# Patient Record
Sex: Female | Born: 1990 | Race: White | Hispanic: No | Marital: Married | Smoking: Never smoker
Health system: Southern US, Community
[De-identification: ages and names within clinical notes are randomized; demographics above are authoritative.]

## PROBLEM LIST (undated history)

## (undated) DIAGNOSIS — Z789 Other specified health status: Secondary | ICD-10-CM

---

## 2019-07-20 ENCOUNTER — Inpatient Hospital Stay (HOSPITAL_COMMUNITY): Payer: BC Managed Care – PPO

## 2019-07-20 ENCOUNTER — Encounter (HOSPITAL_COMMUNITY): Payer: Self-pay | Admitting: Obstetrics & Gynecology

## 2019-07-20 ENCOUNTER — Emergency Department: Admission: EM | Admit: 2019-07-20 | Discharge: 2019-07-20 | Discharge status: Against Medical Advice | Payer: Self-pay

## 2019-07-20 ENCOUNTER — Other Ambulatory Visit: Payer: Self-pay

## 2019-07-20 ENCOUNTER — Inpatient Hospital Stay (HOSPITAL_COMMUNITY)
Admission: AD | Admit: 2019-07-20 | Discharge: 2019-07-20 | Disposition: A | Discharge status: Home / Self Care | Payer: BC Managed Care – PPO | Attending: Obstetrics & Gynecology | Admitting: Obstetrics & Gynecology

## 2019-07-20 DIAGNOSIS — O034 Incomplete spontaneous abortion without complication: Secondary | ICD-10-CM

## 2019-07-20 DIAGNOSIS — O046 Delayed or excessive hemorrhage following (induced) termination of pregnancy: Secondary | ICD-10-CM

## 2019-07-20 DIAGNOSIS — O0489 (Induced) termination of pregnancy with other complications: Secondary | ICD-10-CM

## 2019-07-20 DIAGNOSIS — O031 Delayed or excessive hemorrhage following incomplete spontaneous abortion: Secondary | ICD-10-CM | POA: Diagnosis present

## 2019-07-20 DIAGNOSIS — O036 Delayed or excessive hemorrhage following complete or unspecified spontaneous abortion: Secondary | ICD-10-CM

## 2019-07-20 HISTORY — DX: Other specified health status: Z78.9

## 2019-07-20 LAB — CBC WITH DIFFERENTIAL/PLATELET
Abs Immature Granulocytes: 0.02 10*3/uL (ref 0.00–0.07)
Basophils Absolute: 0 10*3/uL (ref 0.0–0.1)
Basophils Relative: 0 %
Eosinophils Absolute: 0.1 10*3/uL (ref 0.0–0.5)
Eosinophils Relative: 1 %
HCT: 40.9 % (ref 36.0–46.0)
Hemoglobin: 13.8 g/dL (ref 12.0–15.0)
Immature Granulocytes: 0 %
Lymphocytes Relative: 23 %
Lymphs Abs: 1.9 10*3/uL (ref 0.7–4.0)
MCH: 31.7 pg (ref 26.0–34.0)
MCHC: 33.7 g/dL (ref 30.0–36.0)
MCV: 94 fL (ref 80.0–100.0)
Monocytes Absolute: 0.4 10*3/uL (ref 0.1–1.0)
Monocytes Relative: 5 %
Neutro Abs: 5.8 10*3/uL (ref 1.7–7.7)
Neutrophils Relative %: 71 %
Platelets: 262 10*3/uL (ref 150–400)
RBC: 4.35 MIL/uL (ref 3.87–5.11)
RDW: 12 % (ref 11.5–15.5)
WBC: 8.3 10*3/uL (ref 4.0–10.5)
nRBC: 0 % (ref 0.0–0.2)

## 2019-07-20 LAB — TYPE AND SCREEN
ABO/RH(D): A POS
Antibody Screen: NEGATIVE

## 2019-07-20 LAB — HCG, QUANTITATIVE, PREGNANCY: hCG, Beta Chain, Quant, S: 50 m[IU]/mL — ABNORMAL HIGH (ref <5)

## 2019-07-20 LAB — ABO/RH: ABO/RH(D): A POS

## 2019-07-20 LAB — POCT PREGNANCY, URINE: Preg Test, Ur: POSITIVE — AB

## 2019-07-20 MED ORDER — MISOPROSTOL 200 MCG PO TABS: ORAL_TABLET | ORAL | 1 refills | Status: AC

## 2019-07-20 MED ORDER — TRAMADOL HCL 50 MG PO TABS: 100.0000 mg | ORAL_TABLET | Freq: Four times a day (QID) | ORAL | 0 refills | Status: AC | PRN

## 2019-07-20 NOTE — MAU Note (Addendum)
Monica Beltran is a 29 y.o. here in MAU reporting: states she had a termination 3 weeks ago. States she is still passing tissue and had some clots yesterday. Took a UPT this morning and it was still showing positive. Some intermittent  cramping.   Onset of complaint: ongoing  Pain score: 0/10  Vitals:   07/20/19 1416  BP: 114/72  Pulse: 80  Resp: 16  Temp: 97.9 F (36.6 C)  SpO2: 99%     Lab orders placed from triage: UPT, pt unable to give sample at this time, pt to wait in the lobby until able to provide a sample

## 2019-07-20 NOTE — Discharge Instructions (Signed)
Prostaglandin-Induced Abortion  Prostaglandin-induced abortion is a procedure that uses prostaglandins to end a pregnancy. Prostaglandins are hormone-like medicines used to start labor by making the uterus tighten (contract). Prostaglandin-induced abortion can be done during the first trimester of pregnancy. Tell a health care provider about:  Any allergies you have.  All medicines you are taking, including vitamins, herbs, eye drops, creams, and over-the-counter medicines.  Any problems you or family members have had with anesthetic medicines.  Any blood disorders you have.  Any surgeries you have had.  Any medical conditions you have. What are the risks? Generally, this is a safe procedure. However, problems may occur, including:  Parts of the placenta and other afterbirth remaining in your uterus. If this happens, you may need to have a procedure where the inside of your uterus is scraped to remove it (dilation and curettage, or D&C).  Heavy bleeding.  Painful cramping that is not relieved by over-the-counter medicines.  Infection.  Failure to end the pregnancy. If this happens, you may need to have a dilation and curettage. What happens before the procedure?  You may have an exam of your outer and inner genitals and reproductive organs (pelvic exam).  You may have a blood test to confirm that you are pregnant.  You may have an ultrasound of your uterus.  After discussing the procedure with your health care provider, you will be asked to sign a consent form.  Ask your health care provider about changing or stopping your regular medicines. This is especially important if you are taking diabetes medicines or blood thinners. What happens during the procedure?  You will take prostaglandin in tablet form by mouth. After you take the medicine, you will be able to go home.  The medicine will induce bleeding that may last for a few hours or up to a few days, similar to a heavy  menstrual period.  Within 1-3 days of taking this medicine, you will be instructed to take more medicine by mouth or by putting it directly into your vagina (suppository). Follow your health care provider's instructions carefully. The procedure may vary among health care providers and hospitals. What happens after the procedure?  You may have cramps for a few days. They may feel like menstrual cramps.  You will continue to have bleeding that may last up to a few days, similar to a heavy menstrual period.  After about 2 weeks, or when you have stopped bleeding, you may have an ultrasound to confirm that you are no longer pregnant. Summary  Prostaglandin-induced abortion is a procedure that uses prostaglandins to end a pregnancy.  This is a safe procedure. However, problems may occur including failure to remove all the placenta and afterbirth and failure to end the pregnancy. Other problems include cramping, bleeding, and infection.  You will take a prostaglandin in tablet form, by mouth. After you take the medicine, you will be able to go home.  After about 2 weeks, or when you have stopped bleeding, you may have an ultrasound to confirm that you are no longer pregnant. This information is not intended to replace advice given to you by your health care provider. Make sure you discuss any questions you have with your health care provider. Document Revised: 02/16/2017 Document Reviewed: 05/24/2016 Elsevier Patient Education  2020 Elsevier Inc.  

## 2019-07-20 NOTE — MAU Provider Note (Addendum)
Chief Complaint: Vaginal Bleeding   First Provider Initiated Contact with Patient 07/20/19 1556     SUBJECTIVE HPI: Monica Beltran is a 29 y.o. [id number] at 1 month S/P elective termination of pregnancy in [place] at 7.4 weeks w/ what sounds like Cytotec (given choice of 4 tablets vaginally or 4 tablets buccally for two doses 24 hours apart. Used medicine vaginally. Two tablets fell out when placing second dose.) She presents to [facility]missions reporting bleeding and passing tissue ever since taking medication for termination. Took UTP this morning and it was still positive. Went to ED in [facility] for same concerns. States she was told that there was still tissue in her uterus and scheduled for F/U but did not keep appt. Has not resumed intercourse.   Is staying in [place] until 07/25/19 then moving to [place] due to husband's new job. Has 65 month-old child and limited support making it difficult to attend appts.    Location: low abd Quality: cramping Severity: mild Duration: 3-4 weeks Context: S/P medical EAB Timing: intermittent Modifying factors: nothing. Hasn't tried anything for the pain. Associated signs and symptoms: pos for vaginal bleeding and passing tissue. Neg for fever, chills,dizziness, vaginal discharge, urinary complaints, gi complaints.    Past Medical History:  Diagnosis Date  . Medical history non-contributory    OB History  Gravida Para Term Preterm AB Living  3       2 1   SAB TAB Ectopic Multiple Live Births    2     1    # Outcome Date GA Lbr Len/2nd Weight Sex Delivery Anes PTL Lv  3 TAB 06/25/19          2 Gravida 03/19/17    M CS-Unspec   LIV  1 TAB 2014           Past Surgical History:  Procedure Laterality Date  . CESAREAN SECTION     Social History   Socioeconomic History  . Marital status: Married    Spouse name: Not on file  . Number of children: Not on file  . Years of education: Not on file  . Highest education level: Not on file  Occupational  History  . Not on file  Tobacco Use  . Smoking status: Never Smoker  . Smokeless tobacco: Never Used  Substance and Sexual Activity  . Alcohol use: Not Currently  . Drug use: Not Currently  . Sexual activity: Yes    Birth control/protection: None  Other Topics Concern  . Not on file  Social History Narrative  . Not on file   Social Determinants of Health   Financial Resource Strain:   . Difficulty of Paying Living Expenses:   Food Insecurity:   . Worried About 2015 in the Last Year:   . Programme researcher, broadcasting/film/video in the Last Year:   Transportation Needs:   . Barista (Medical):   Freight forwarder Lack of Transportation (Non-Medical):   Physical Activity:   . Days of Exercise per Week:   . Minutes of Exercise per Session:   Stress:   . Feeling of Stress :   Social Connections:   . Frequency of Communication with Friends and Family:   . Frequency of Social Gatherings with Friends and Family:   . Attends Religious Services:   . Active Member of Clubs or Organizations:   . Attends [employer] Meetings:   Banker Marital Status:   Intimate Partner Violence:   . Fear of  Current or Ex-Partner:   . Emotionally Abused:   [employer] Physically Abused:   . Sexually Abused:    History reviewed. No pertinent family history. No current facility-administered medications on file prior to encounter.   No current outpatient medications on file prior to encounter.   No Known Allergies  I have reviewed patient's Past Medical Hx, Surgical Hx, Family Hx, Social Hx, medications and allergies.   Review of Systems  Constitutional: Negative for chills and fever.  Gastrointestinal: Positive for abdominal pain. Negative for constipation, diarrhea, nausea and vomiting.  Genitourinary: Positive for vaginal bleeding. Negative for dysuria, frequency, urgency and vaginal discharge.  Neurological: Negative for dizziness.    OBJECTIVE Patient Vitals for the past 24 hrs:  BP Temp Temp src Pulse  Resp SpO2 Height Weight  07/20/19 1416 114/72 97.9 F (36.6 C) Oral 80 16 99 % - -  07/20/19 1412 - - - - - - 5\' 3"  (1.6 m) 66.7 kg   Constitutional: Well-developed, well-nourished female in no acute distress.  Skin: No pallor Cardiovascular: normal rate Respiratory: normal rate and effort.  GI: Abd soft, non-tender. MS: Extremities nontender, no edema, normal ROM Neurologic: Alert and oriented x 4.  GU:  SPECULUM EXAM: NEFG, physiologic discharge, small amount of blood noted, cervix clean. No tissue in vagina or os.  BIMANUAL: cervix closed; uterus top-normal size, no adnexal tenderness or masses. No CMT.  LAB RESULTS Results for orders placed or performed during the hospital encounter of 07/20/19 (from the past 24 hour(s))  Pregnancy, urine POC     Status: Abnormal   Collection Time: 07/20/19  3:02 PM  Result Value Ref Range   Preg Test, Ur POSITIVE (A) NEGATIVE  hCG, quantitative, pregnancy     Status: Abnormal   Collection Time: 07/20/19  3:29 PM  Result Value Ref Range   hCG, Beta Chain, Quant, S 50 (H) <5 mIU/mL  CBC with Differential/Platelet     Status: None   Collection Time: 07/20/19  3:29 PM  Result Value Ref Range   WBC 8.3 4.0 - 10.5 K/uL   RBC 4.35 3.87 - 5.11 MIL/uL   Hemoglobin 13.8 12.0 - 15.0 g/dL   HCT 09/19/19 98.3 - 38.2 %   MCV 94.0 80.0 - 100.0 fL   MCH 31.7 26.0 - 34.0 pg   MCHC 33.7 30.0 - 36.0 g/dL   RDW 50.5 39.7 - 67.3 %   Platelets 262 150 - 400 K/uL   nRBC 0.0 0.0 - 0.2 %   Neutrophils Relative % 71 %   Neutro Abs 5.8 1.7 - 7.7 K/uL   Lymphocytes Relative 23 %   Lymphs Abs 1.9 0.7 - 4.0 K/uL   Monocytes Relative 5 %   Monocytes Absolute 0.4 0.1 - 1.0 K/uL   Eosinophils Relative 1 %   Eosinophils Absolute 0.1 0.0 - 0.5 K/uL   Basophils Relative 0 %   Basophils Absolute 0.0 0.0 - 0.1 K/uL   Immature Granulocytes 0 %   Abs Immature Granulocytes 0.02 0.00 - 0.07 K/uL  Type and screen     Status: None   Collection Time: 07/20/19  3:29 PM   Result Value Ref Range   ABO/RH(D) A POS    Antibody Screen NEG    Sample Expiration      07/23/2019,2359 Performed at [facility], [address], [place], [place]   ABO/Rh     Status: None (Preliminary result)   Collection Time: 07/20/19  3:29 PM  Result  Value Ref Range   ABO/RH(D)      A POS Performed at [facility], [address], [place], [place] [address]     IMAGING US OB LESS THAN 14 WEEKS WITH OB TRANSVAGINAL  Result Date: 07/20/2019 CLINICAL DATA:  Retained products of conception. Status post medical abortion on 06/19/2019 EXAM: OBSTETRIC <14 WK [place] AND TRANSVAGINAL OB US TECHNIQUE: Both transabdominal and transvaginal ultrasound examinations were performed for complete evaluation of the gestation as well as the maternal uterus, adnexal regions, and pelvic cul-de-sac. Transvaginal technique was performed to assess early pregnancy. COMPARISON:  None. FINDINGS: Intrauterine gestational sac: None Yolk sac:  Not Visualized. Embryo:  Not Visualized. Maternal uterus/adnexae: The endometrium is thickened measuring approximately 1.7 cm. The endometrium is heterogeneous without definite increased vascularity. A prior Caesarean section scar is noted. The ovaries are unremarkable. There is no pelvic free fluid. IMPRESSION: 1. No IUP. 2. Thickened heterogeneous endometrium without evidence for increased vascularity. Findings are nonspecific and could indicate endometrial blood products although hypovascular retained products of conception cannot be fully excluded. Consider short-term follow-up pelvic ultrasound or further evaluation with pelvic MRI without and with IV contrast, as clinically warranted. Electronically Signed   By: Katherine Mantle M.D.   On: 07/20/2019 17:15    MAU COURSE Orders Placed This Encounter  Procedures  . US OB LESS THAN 14 WEEKS WITH OB TRANSVAGINAL  . hCG, quantitative, pregnancy  . CBC with Differential/Platelet  . Diet NPO time  specified  . Pregnancy, urine POC  . Type and screen  . ABO/Rh  . Discharge patient   Meds ordered this encounter  Medications  . misoprostol (CYTOTEC) 200 MCG tablet    Sig: Place four tablets in between your gums and cheeks (two tablets on each side) as instructed OR insert four tablets vaginally. Repeat dose in 24 hours.    Dispense:  4 tablet    Refill:  1    Order Specific Question:   Supervising Provider    Answer:   Adam Phenix [[id number]]  . traMADol (ULTRAM) 50 MG tablet    Sig: Take 2 tablets (100 mg total) by mouth every 6 (six) hours as needed for severe pain.    Dispense:  20 tablet    Refill:  0    Order Specific Question:   Supervising Provider    Answer:   Adam Phenix [phone]    MDM - Discussed concern for retained POCs per Hx, [place] and Hcg w/ pt. No Sx infection. Discussed repeating Cytotec and need for F/U to make sure it has worked. Initially doubtful that she can F/U before moving to [place]. Dicussed w/ Dr. Debroah Loop. Offered D&C this week. Pt wants to try Cytotec again instead. Can't get childcare for D&C. Explained importance of F/U to ensure completion of evacuation of POCs and risk of infection if not.   ASSESSMENT 1. Retained products of conception following abortion   2. Abortion complicated by delayed or excessive hemorrhage     PLAN Discharge home in stable condition. Infection and bleeding precautions Follow-up Information    [facility] Follow up on 07/23/2019.   Specialty: Obstetrics and Gynecology Why: will call to schedule follow-up appointment  Contact information: [address] [place] [address] [phone]       [facility] Follow up.   Specialty: Obstetrics and Gynecology Why: as needed for pregnancy-related emergencies (severe bleeding, fever greater than 100.4, severe  stomach pain) Contact information: [address] [id number] [place] [phone]         Allergies as of 07/20/2019   No Known Allergies     Medication List    TAKE these medications   misoprostol 200 MCG tablet Commonly known as: CYTOTEC Place four tablets in between your gums and cheeks (two tablets on each side) as instructed OR insert four tablets vaginally. Repeat dose in 24 hours.   traMADol 50 MG tablet Commonly known as: Ultram Take 2 tablets (100 mg total) by mouth every 6 (six) hours as needed for severe pain.        Tamala Julian, [place], [place] 07/20/2019  6:17 PM

## 2019-07-21 LAB — GC/CHLAMYDIA PROBE AMP (~~LOC~~) NOT AT ARMC
Chlamydia: NEGATIVE
Comment: NEGATIVE
Comment: NORMAL
Neisseria Gonorrhea: NEGATIVE

## 2019-07-24 ENCOUNTER — Encounter: Payer: Self-pay | Admitting: Obstetrics & Gynecology

## 2019-07-24 ENCOUNTER — Other Ambulatory Visit: Payer: Self-pay

## 2019-07-24 ENCOUNTER — Ambulatory Visit (INDEPENDENT_AMBULATORY_CARE_PROVIDER_SITE_OTHER): Payer: Self-pay | Admitting: Obstetrics & Gynecology

## 2019-07-24 VITALS — BP 119/78 | HR 72 | Wt 144.5 lb

## 2019-07-24 DIAGNOSIS — O039 Complete or unspecified spontaneous abortion without complication: Secondary | ICD-10-CM

## 2019-07-24 DIAGNOSIS — Z5189 Encounter for other specified aftercare: Secondary | ICD-10-CM

## 2019-07-24 NOTE — Progress Notes (Signed)
GYNECOLOGY OFFICE VISIT NOTE  History:   Monica Beltran is a 29 y.o. [id number] here today for follow up miscarriage. Was recently seen in MAU and endometrial thickness of 68mm without flow was noted. There was a low concern for retained products of conception, Misoprostol prescribed. She reports mild bleeding afterwards. No other concerns.  She denies any current abnormal vaginal discharge, pelvic pain or other concerns.    Past Medical History:  Diagnosis Date  . Medical history non-contributory     Past Surgical History:  Procedure Laterality Date  . CESAREAN SECTION      The following portions of the patient's history were reviewed and updated as appropriate: allergies, current medications, past family history, past medical history, past social history, past surgical history and problem list.   Review of Systems:  Pertinent items noted in HPI and remainder of comprehensive ROS otherwise negative.  Physical Exam:  BP 119/78   Pulse 72   Wt 144 lb 8 oz (65.5 kg)   BMI 25.60 kg/m  CONSTITUTIONAL: Well-developed, well-nourished female in no acute distress.  HEENT:  Normocephalic, atraumatic. External right and left ear normal. No scleral icterus.  NECK: Normal range of motion, supple, no masses noted on observation SKIN: No rash noted. Not diaphoretic. No erythema. No pallor. MUSCULOSKELETAL: Normal range of motion. No edema noted. NEUROLOGIC: Alert and oriented to person, place, and time. Normal muscle tone coordination. No cranial nerve deficit noted. PSYCHIATRIC: Normal mood and affect. Normal behavior. Normal judgment and thought content. CARDIOVASCULAR: Normal heart rate noted RESPIRATORY: Effort and breath sounds normal, no problems with respiration noted ABDOMEN: No masses noted. No other overt distention noted.   PELVIC: Deferred  Labs and Imaging Results for orders placed or performed during the hospital encounter of 07/20/19 (from the past 168 hour(s))  Pregnancy,  urine POC   Collection Time: 07/20/19  3:02 PM  Result Value Ref Range   Preg Test, Ur POSITIVE (A) NEGATIVE  hCG, quantitative, pregnancy   Collection Time: 07/20/19  3:29 PM  Result Value Ref Range   hCG, Beta Chain, Quant, S 50 (H) <5 mIU/mL  CBC with Differential/Platelet   Collection Time: 07/20/19  3:29 PM  Result Value Ref Range   WBC 8.3 4.0 - 10.5 K/uL   RBC 4.35 3.87 - 5.11 MIL/uL   Hemoglobin 13.8 12.0 - 15.0 g/dL   HCT 94.8 54.6 - 27.0 %   MCV 94.0 80.0 - 100.0 fL   MCH 31.7 26.0 - 34.0 pg   MCHC 33.7 30.0 - 36.0 g/dL   RDW 35.0 09.3 - 81.8 %   Platelets 262 150 - 400 K/uL   nRBC 0.0 0.0 - 0.2 %   Neutrophils Relative % 71 %   Neutro Abs 5.8 1.7 - 7.7 K/uL   Lymphocytes Relative 23 %   Lymphs Abs 1.9 0.7 - 4.0 K/uL   Monocytes Relative 5 %   Monocytes Absolute 0.4 0.1 - 1.0 K/uL   Eosinophils Relative 1 %   Eosinophils Absolute 0.1 0.0 - 0.5 K/uL   Basophils Relative 0 %   Basophils Absolute 0.0 0.0 - 0.1 K/uL   Immature Granulocytes 0 %   Abs Immature Granulocytes 0.02 0.00 - 0.07 K/uL  Type and screen   Collection Time: 07/20/19  3:29 PM  Result Value Ref Range   ABO/RH(D) A POS    Antibody Screen NEG    Sample Expiration      07/23/2019,2359 Performed at [facility], [address], [place], [place] [address]   ABO/Rh   Collection Time: 07/20/19  3:29 PM  Result Value Ref Range   ABO/RH(D)      A POS Performed at [place] [address], [place], [place] [address]   GC/Chlamydia probe amp ([place])not at [facility]   Collection Time: 07/20/19  4:01 PM  Result Value Ref Range   Neisseria Gonorrhea Negative    Chlamydia Negative    Comment Normal Reference Ranger Chlamydia - Negative    Comment      Normal Reference Range Neisseria Gonorrhea - Negative   US OB LESS THAN 14 WEEKS WITH OB TRANSVAGINAL  Result Date: 07/20/2019 CLINICAL DATA:  Retained products of conception. Status post medical abortion on 06/19/2019 EXAM: OBSTETRIC  <14 WK [place] AND TRANSVAGINAL OB US TECHNIQUE: Both transabdominal and transvaginal ultrasound examinations were performed for complete evaluation of the gestation as well as the maternal uterus, adnexal regions, and pelvic cul-de-sac. Transvaginal technique was performed to assess early pregnancy. COMPARISON:  None. FINDINGS: Intrauterine gestational sac: None Yolk sac:  Not Visualized. Embryo:  Not Visualized. Maternal uterus/adnexae: The endometrium is thickened measuring approximately 1.7 cm. The endometrium is heterogeneous without definite increased vascularity. A prior Caesarean section scar is noted. The ovaries are unremarkable. There is no pelvic free fluid. IMPRESSION: 1. No IUP. 2. Thickened heterogeneous endometrium without evidence for increased vascularity. Findings are nonspecific and could indicate endometrial blood products although hypovascular retained products of conception cannot be fully excluded. Consider short-term follow-up pelvic ultrasound or further evaluation with pelvic MRI without and with IV contrast, as clinically warranted. Electronically Signed   By: Constance Holster M.D.   On: 07/20/2019 17:15      Assessment and Plan:    1. Follow-up visit after miscarriage No concerns.  Very low concern given ultrasound findings. Discussed future pregnancy plans, she wants to conceive again in one year. Discussed birth control, she declines all hormonal methods due to past side effects.  Encouraged fertility awareness/ovulation tracker apps, condoms etc.  Patient is moving to [place], [place]. Will let us know if she needs [place] to send her records.  Routine preventative health maintenance measures emphasized. Please refer to After Visit Summary for other counseling recommendations.   Return for any gynecologic concerns.    Total face-to-face time with patient: 10 minutes.  Over 50% of encounter was spent on counseling and coordination of care.   Verita Schneiders, MD,  [place] for [employer], [place]

## 2019-07-24 NOTE — Patient Instructions (Signed)
Return to clinic for any scheduled appointments or for any gynecologic concerns as needed.   

## 2021-03-22 IMAGING — US US OB < 14 WEEKS - US OB TV
1 series · 15 of 28 positions shown · non-contrast
Comparison: None.

CLINICAL DATA: Retained products of conception. Status post medical
abortion on 06/19/2019

EXAM:
OBSTETRIC <14 WK US AND TRANSVAGINAL OB US
TECHNIQUE: Both transabdominal and transvaginal ultrasound examinations were
performed for complete evaluation of the gestation as well as the
maternal uterus, adnexal regions, and pelvic cul-de-sac.
Transvaginal technique was performed to assess early pregnancy.

[Series 1: us ob < 14 weeks - us ob tv · 59 acquisitions, 15 frames shown]
[im 1/59]
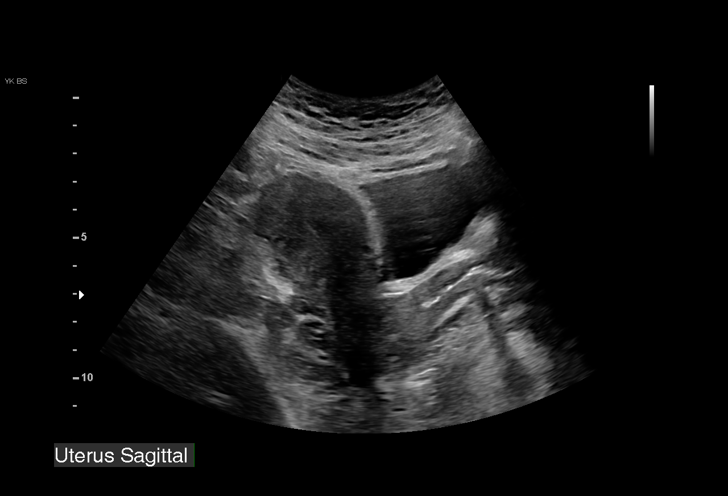
[im 5/59]
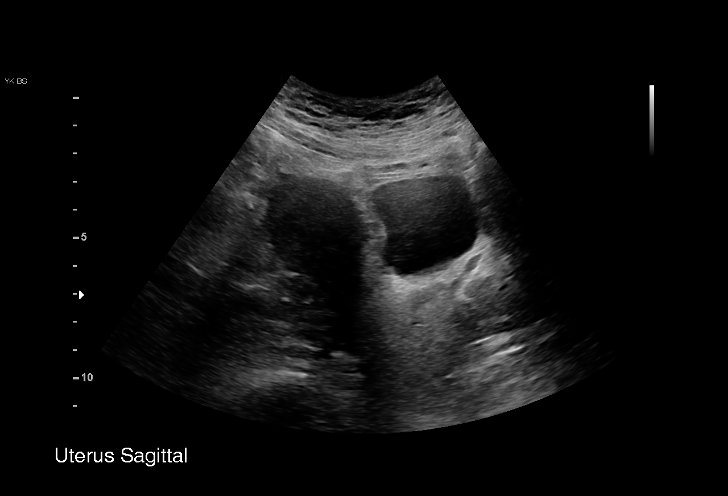
[im 9/59]
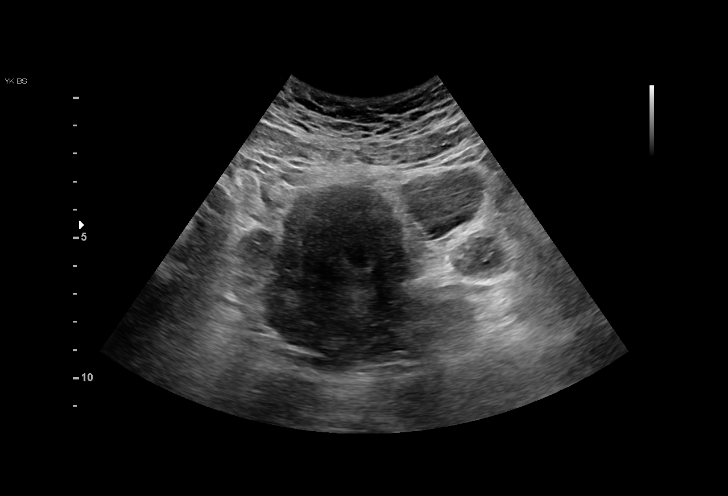
[im 13/59]
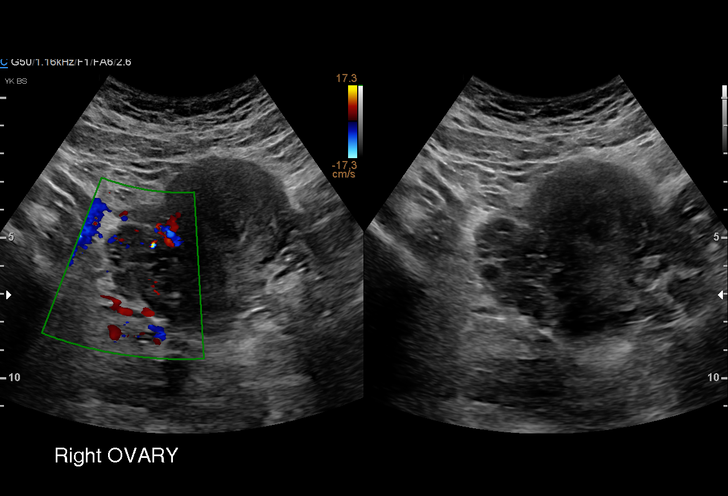
[im 18/59]
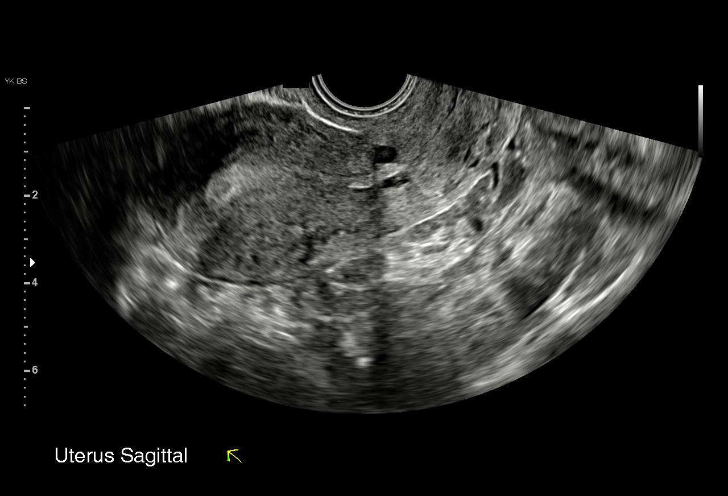
[im 22/59]
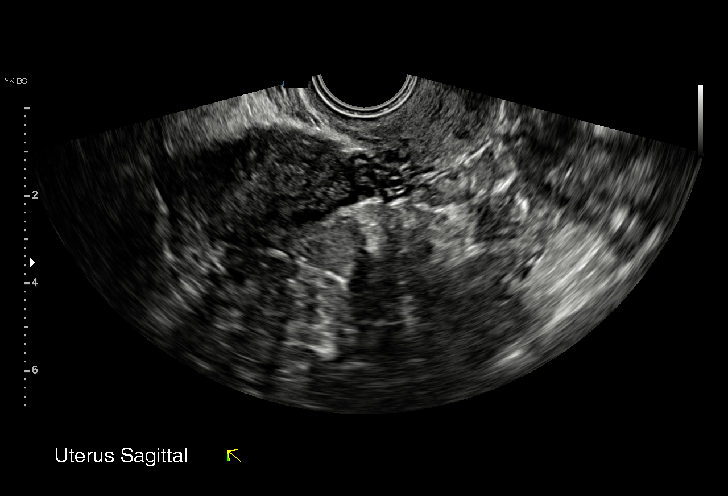
[im 26/59]
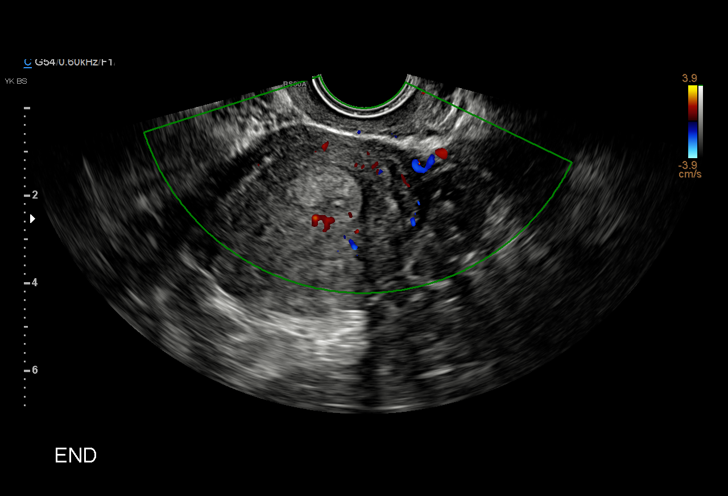
[im 31/59]
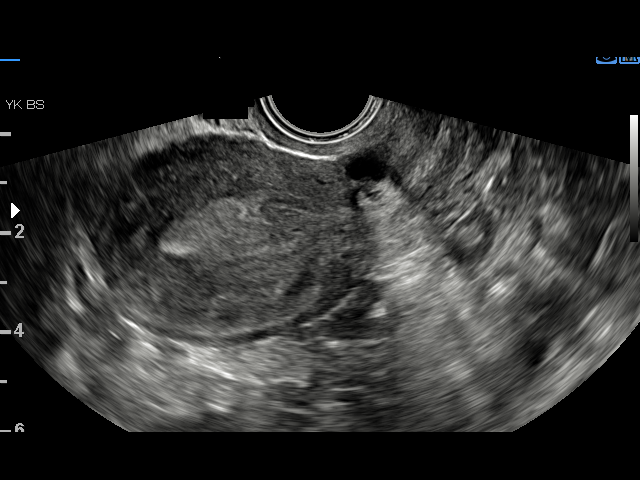
[im 33/59]
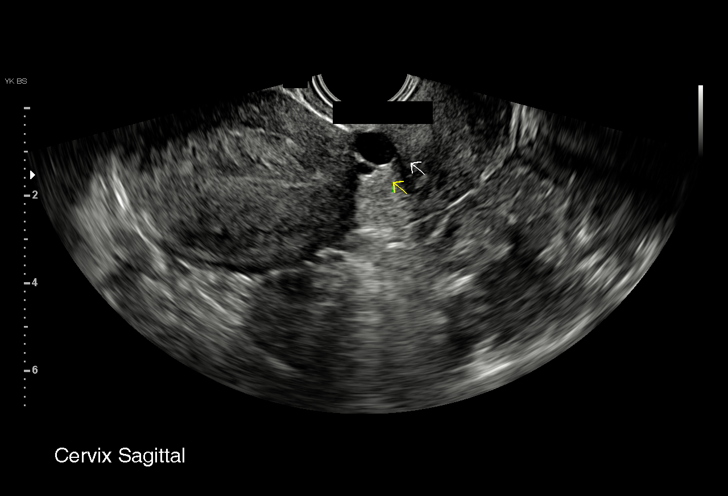
[im 37/59]
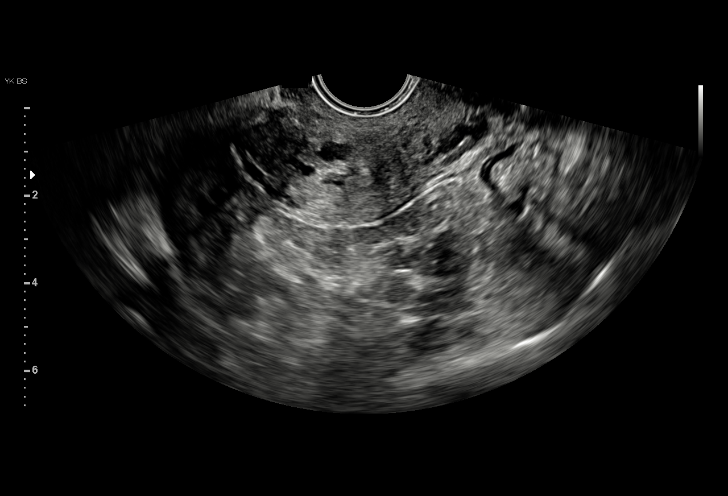
[im 41/59]
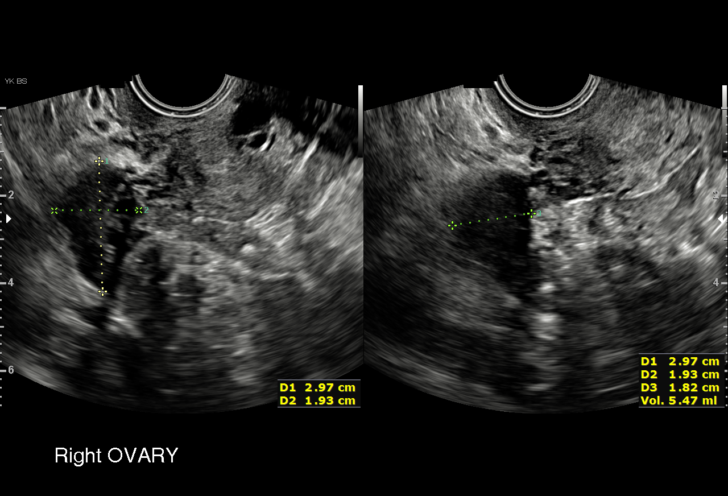
[im 46/59]
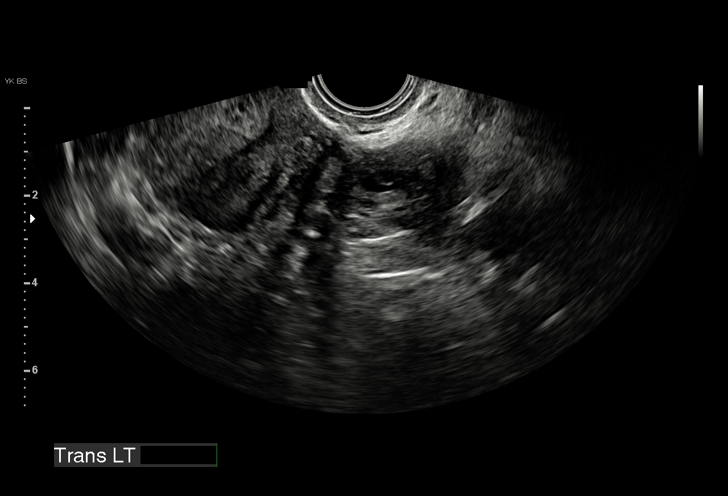
[im 50/59]
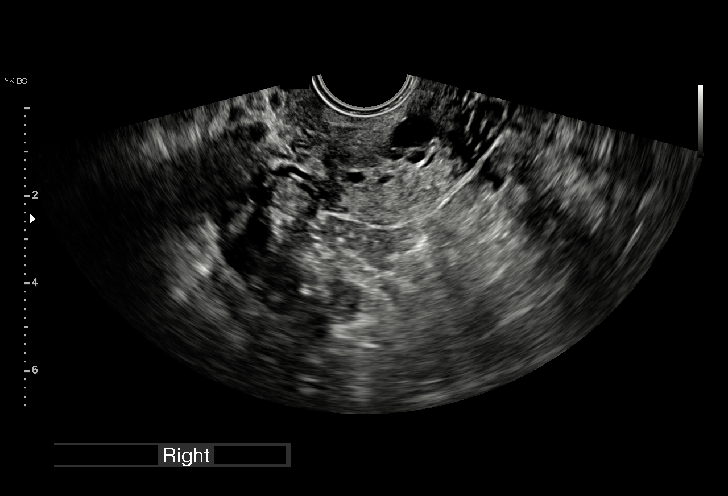
[im 54/59]
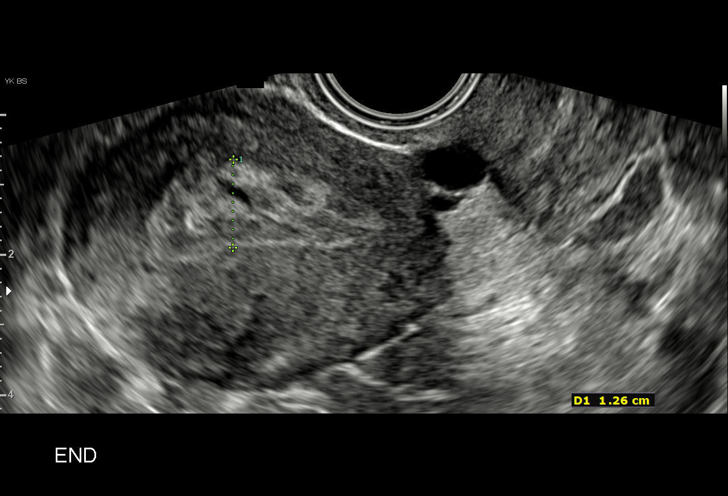
[im 59/59]
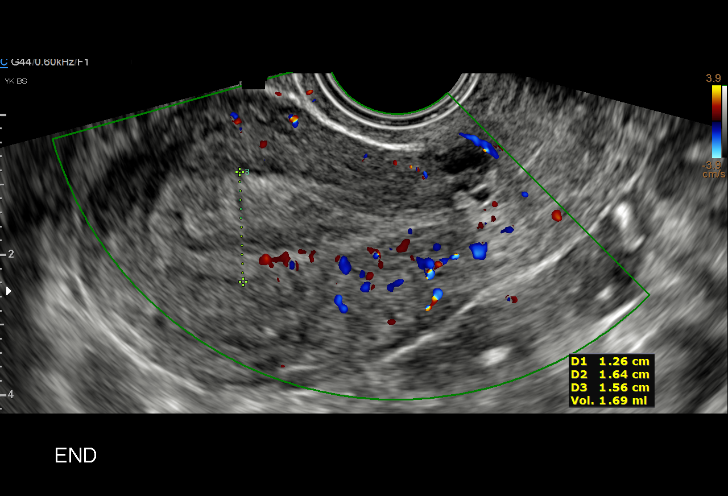

[15 of 28 positions shown; findings below may reference images not displayed]

FINDINGS: Intrauterine gestational sac: None

Yolk sac:  Not Visualized.

Embryo:  Not Visualized.

Maternal uterus/adnexae: The endometrium is thickened measuring
approximately 1.7 cm. The endometrium is heterogeneous without
definite increased vascularity. A prior Caesarean section scar is
noted. The ovaries are unremarkable. There is no pelvic free fluid.
IMPRESSION: 1. No IUP.
2. Thickened heterogeneous endometrium without evidence for
increased vascularity. Findings are nonspecific and could indicate
endometrial blood products although hypovascular retained products
of conception cannot be fully excluded. Consider short-term
follow-up pelvic ultrasound or further evaluation with pelvic MRI
without and with IV contrast, as clinically warranted.
